# Patient Record
Sex: Female | Born: 1965 | Race: White | Hispanic: No | Marital: Married | Smoking: Never smoker
Health system: Southern US, Community
[De-identification: ages and names within clinical notes are randomized; demographics above are authoritative.]

## PROBLEM LIST (undated history)

## (undated) DIAGNOSIS — J45909 Unspecified asthma, uncomplicated: Secondary | ICD-10-CM

## (undated) DIAGNOSIS — E079 Disorder of thyroid, unspecified: Secondary | ICD-10-CM

## (undated) DIAGNOSIS — I10 Essential (primary) hypertension: Secondary | ICD-10-CM

## (undated) HISTORY — PX: VARICOSE VEIN SURGERY: SHX832

## (undated) HISTORY — PX: OTHER SURGICAL HISTORY: SHX169

## (undated) HISTORY — PX: CHOLECYSTECTOMY: SHX55

## (undated) HISTORY — PX: TONSILLECTOMY: SUR1361

## (undated) HISTORY — PX: HERNIA REPAIR: SHX51

---

## 2017-06-18 ENCOUNTER — Ambulatory Visit
Admission: RE | Admit: 2017-06-18 | Discharge: 2017-06-18 | Disposition: A | Discharge status: Home / Self Care | Payer: BC Managed Care – PPO | Source: Ambulatory Visit | Attending: Family Medicine | Admitting: Family Medicine

## 2017-06-18 ENCOUNTER — Other Ambulatory Visit: Payer: Self-pay | Admitting: Family Medicine

## 2017-06-18 DIAGNOSIS — R0602 Shortness of breath: Secondary | ICD-10-CM

## 2017-06-18 DIAGNOSIS — J4530 Mild persistent asthma, uncomplicated: Secondary | ICD-10-CM | POA: Diagnosis not present

## 2017-10-08 ENCOUNTER — Other Ambulatory Visit: Payer: Self-pay | Admitting: Family Medicine

## 2017-10-08 DIAGNOSIS — M7989 Other specified soft tissue disorders: Secondary | ICD-10-CM

## 2017-10-08 DIAGNOSIS — M79662 Pain in left lower leg: Secondary | ICD-10-CM

## 2017-10-09 ENCOUNTER — Ambulatory Visit
Admission: RE | Admit: 2017-10-09 | Discharge: 2017-10-09 | Disposition: A | Discharge status: Home / Self Care | Payer: BC Managed Care – PPO | Source: Ambulatory Visit | Attending: Family Medicine | Admitting: Family Medicine

## 2017-10-09 DIAGNOSIS — M7989 Other specified soft tissue disorders: Secondary | ICD-10-CM | POA: Diagnosis not present

## 2017-10-09 DIAGNOSIS — M79662 Pain in left lower leg: Secondary | ICD-10-CM | POA: Diagnosis not present

## 2018-05-18 IMAGING — US US EXTREM LOW VENOUS*L*
1 series · 13 of 24 positions shown · non-contrast
Comparison: None.

CLINICAL DATA: 51-year-old female with left calf pain



[Series 1: us extrem low venous*left* · 0.08mm/px · 13 of 26 slices shown]
[im 1/26]
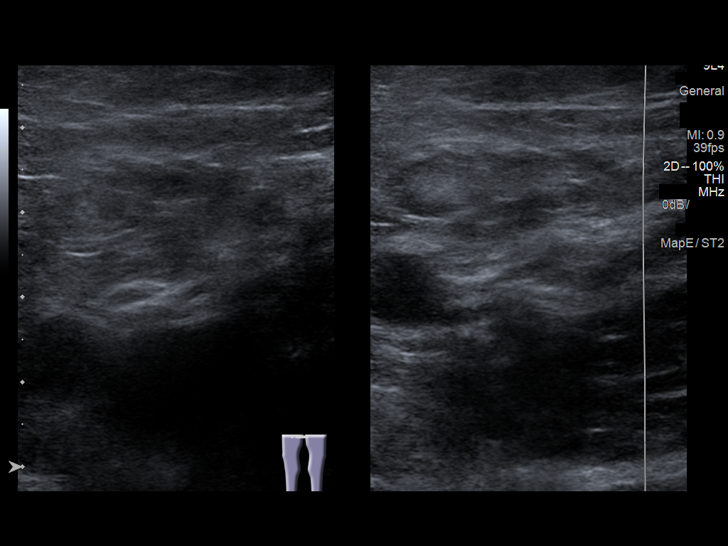
[im 3/26]
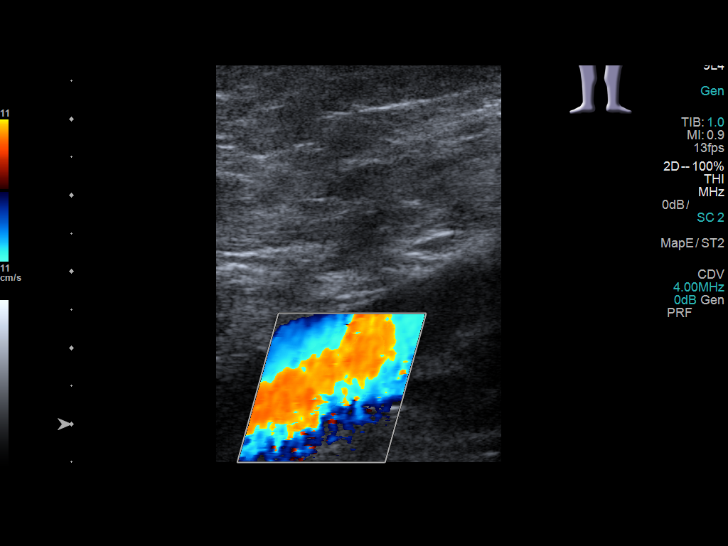
[im 5/26]
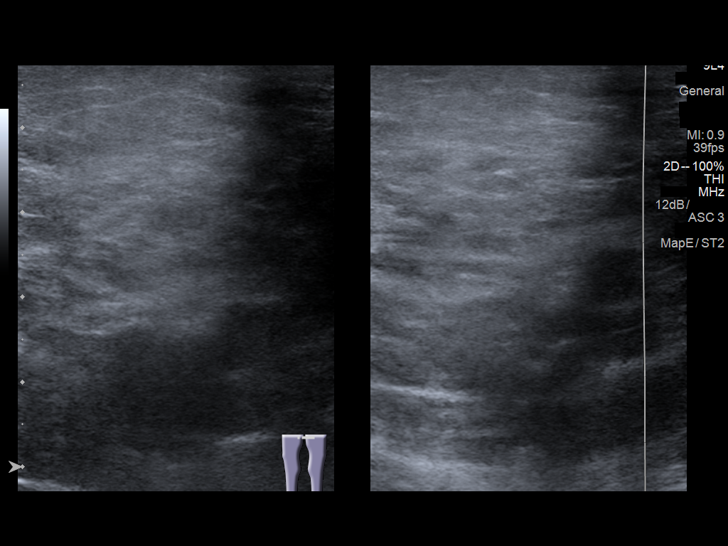
[im 7/26]
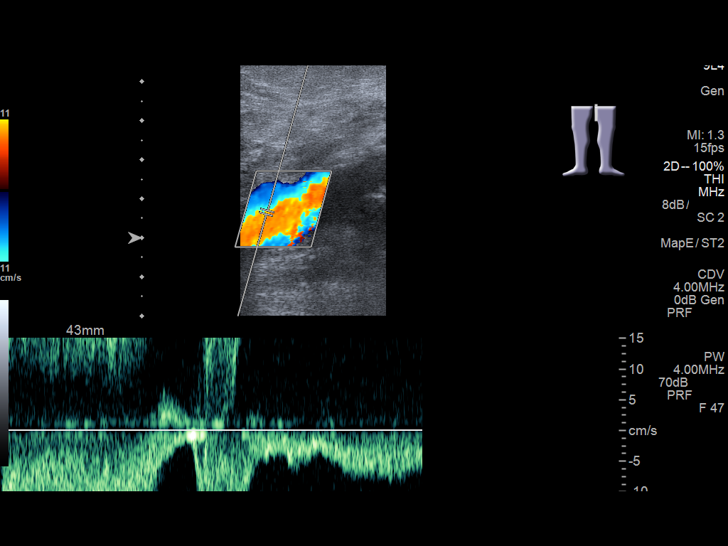
[im 9/26]
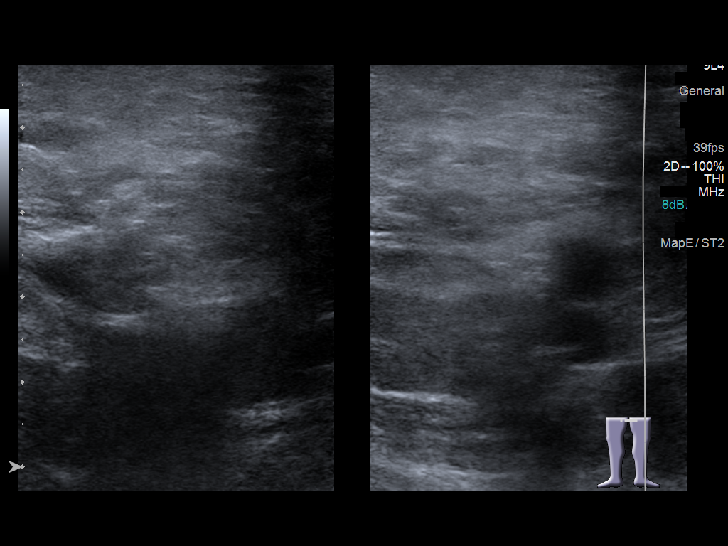
[im 11/26]
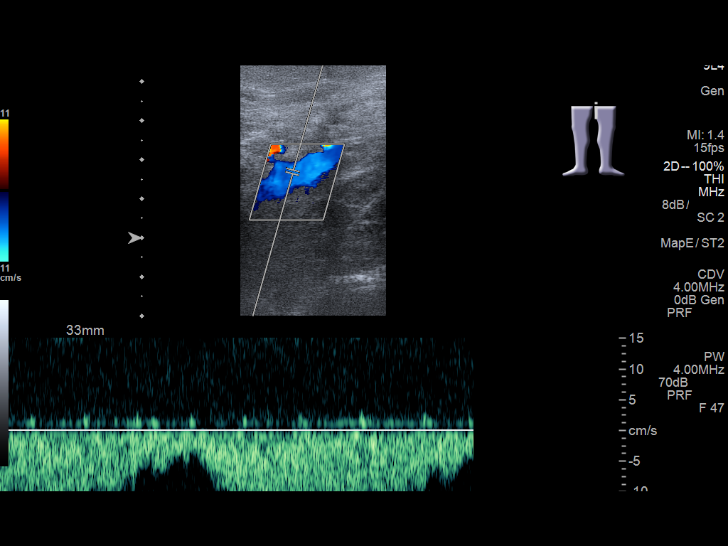
[im 14/26]
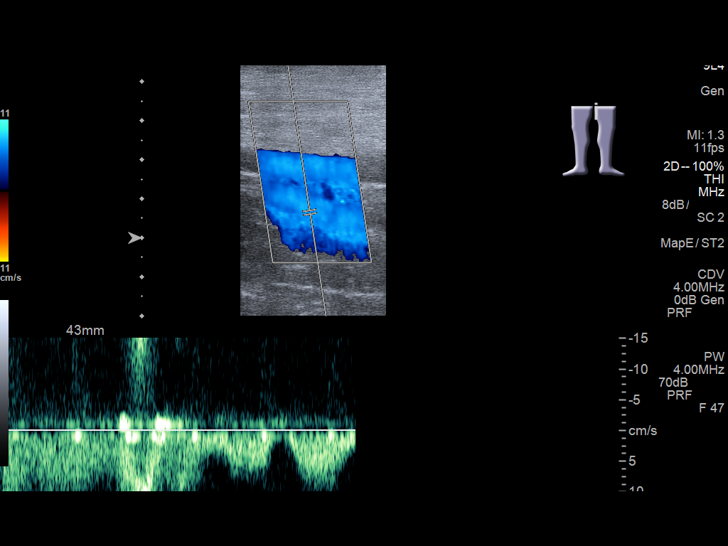
[im 15/26]
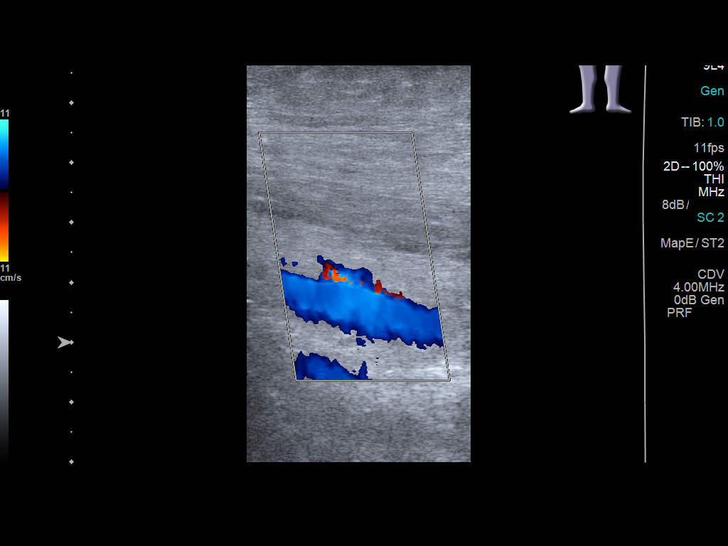
[im 17/26]
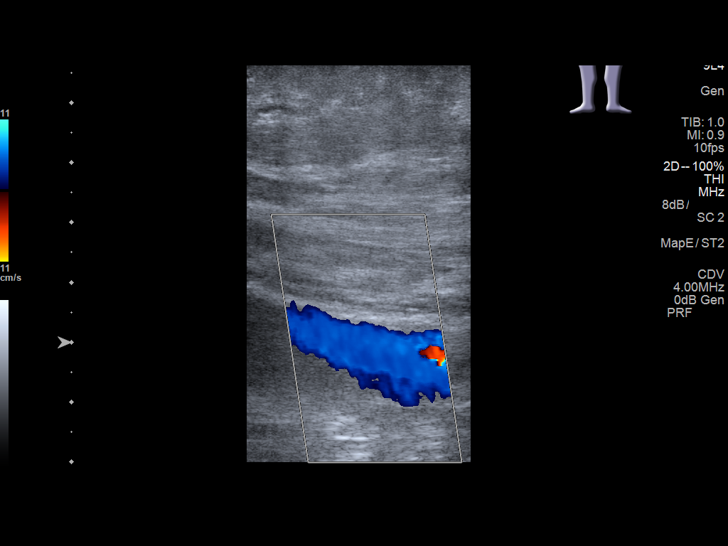
[im 19/26]
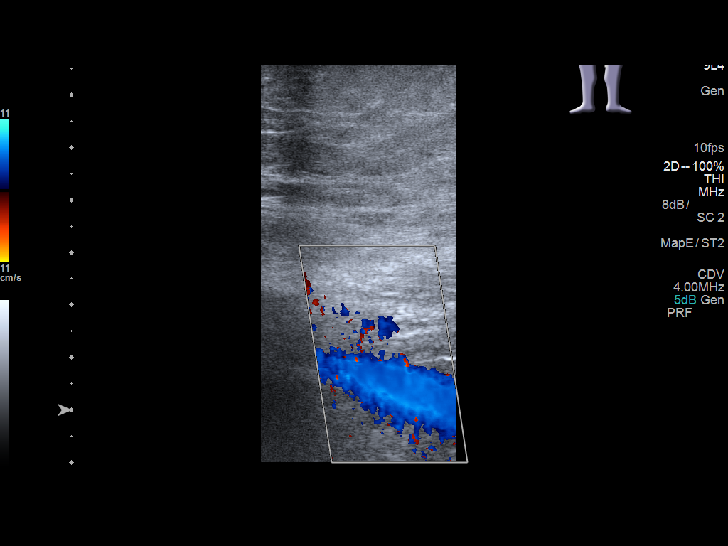
[im 21/26]
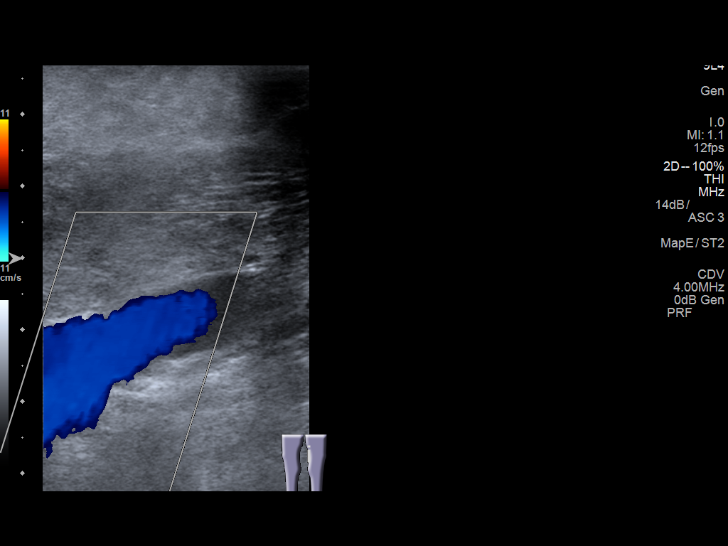
[im 23/26]
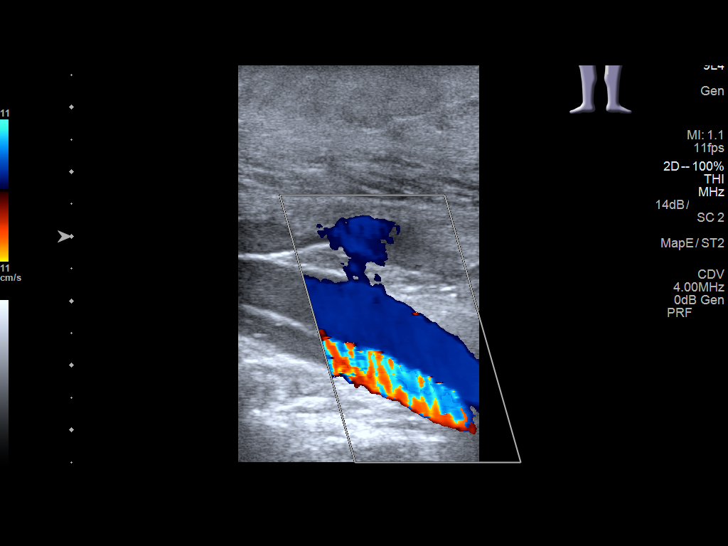
[im 26/26]
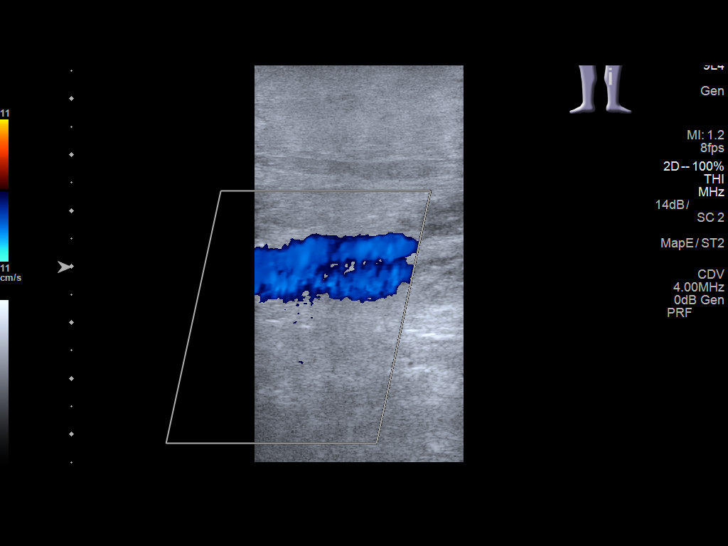

[13 of 24 positions shown; findings below may reference images not displayed]

FINDINGS: Contralateral Common Femoral Vein: Respiratory phasicity is normal
and symmetric with the symptomatic side. No evidence of thrombus.
Normal compressibility.

Common Femoral Vein: No evidence of thrombus. Normal
compressibility, respiratory phasicity and response to augmentation.

Saphenofemoral Junction: No evidence of thrombus. Normal
compressibility and flow on color Doppler imaging.

Profunda Femoral Vein: No evidence of thrombus. Normal
compressibility and flow on color Doppler imaging.

Femoral Vein: No evidence of thrombus. Normal compressibility,
respiratory phasicity and response to augmentation.

Popliteal Vein: No evidence of thrombus. Normal compressibility,
respiratory phasicity and response to augmentation.

Calf Veins: No evidence of thrombus. Normal compressibility and flow
on color Doppler imaging.

Superficial Great Saphenous Vein: No evidence of thrombus. Normal
compressibility.

Venous Reflux:  None.

Other Findings:  None.
IMPRESSION: No evidence of deep venous thrombosis.

## 2020-07-18 ENCOUNTER — Encounter: Payer: Self-pay | Admitting: Emergency Medicine

## 2020-07-18 ENCOUNTER — Ambulatory Visit
Admission: EM | Admit: 2020-07-18 | Discharge: 2020-07-18 | Disposition: A | Discharge status: Home / Self Care | Payer: BC Managed Care – PPO | Attending: Emergency Medicine | Admitting: Emergency Medicine

## 2020-07-18 ENCOUNTER — Other Ambulatory Visit: Payer: Self-pay

## 2020-07-18 DIAGNOSIS — I10 Essential (primary) hypertension: Secondary | ICD-10-CM | POA: Insufficient documentation

## 2020-07-18 DIAGNOSIS — Z79890 Hormone replacement therapy (postmenopausal): Secondary | ICD-10-CM | POA: Insufficient documentation

## 2020-07-18 DIAGNOSIS — Z20822 Contact with and (suspected) exposure to covid-19: Secondary | ICD-10-CM | POA: Diagnosis not present

## 2020-07-18 DIAGNOSIS — Z9049 Acquired absence of other specified parts of digestive tract: Secondary | ICD-10-CM | POA: Insufficient documentation

## 2020-07-18 DIAGNOSIS — E079 Disorder of thyroid, unspecified: Secondary | ICD-10-CM | POA: Insufficient documentation

## 2020-07-18 DIAGNOSIS — Z79899 Other long term (current) drug therapy: Secondary | ICD-10-CM | POA: Diagnosis not present

## 2020-07-18 DIAGNOSIS — J029 Acute pharyngitis, unspecified: Secondary | ICD-10-CM | POA: Insufficient documentation

## 2020-07-18 DIAGNOSIS — J0100 Acute maxillary sinusitis, unspecified: Secondary | ICD-10-CM | POA: Diagnosis not present

## 2020-07-18 DIAGNOSIS — R05 Cough: Secondary | ICD-10-CM | POA: Insufficient documentation

## 2020-07-18 DIAGNOSIS — J45909 Unspecified asthma, uncomplicated: Secondary | ICD-10-CM | POA: Insufficient documentation

## 2020-07-18 HISTORY — DX: Unspecified asthma, uncomplicated: J45.909

## 2020-07-18 HISTORY — DX: Essential (primary) hypertension: I10

## 2020-07-18 HISTORY — DX: Disorder of thyroid, unspecified: E07.9

## 2020-07-18 LAB — GROUP A STREP BY PCR: Group A Strep by PCR: NOT DETECTED

## 2020-07-18 MED ORDER — AMOXICILLIN-POT CLAVULANATE 875-125 MG PO TABS: 1.0000 | ORAL_TABLET | Freq: Two times a day (BID) | ORAL | 0 refills | Status: AC

## 2020-07-18 NOTE — ED Triage Notes (Signed)
Patient c/o sore throat, cough and headache that started 2 weeks ago.

## 2020-07-18 NOTE — Discharge Instructions (Signed)
Begin antibiotics for suspected sinus infection.  Also, consider use of nasal saline and Flonase.  Increase fluid intake and rest.  Covid test has been sent and your result be back within 24 hours.  Blood pressure is elevated today.  It is not significantly elevated so at this time you can just monitor yourself at home.  Keep a log of your blood pressures at least daily and follow-up with your PCP in case they would want to do any medication changes.  Also try to increase her exercise and lose weight.  You have received COVID testing today either for positive exposure, concerning symptoms that could be related to COVID infection, screening purposes, or re-testing after confirmed positive.  Your test obtained today checks for active viral infection in the last 1-2 weeks. If your test is negative now, you can still test positive later. So, if you do develop symptoms you should either get re-tested and/or isolate x 10 days. Please follow CDC guidelines.  While Rapid antigen tests come back in 15-20 minutes, send out PCR/molecular test results typically come back within 24 hours. In the mean time, if you are symptomatic, assume this could be a positive test and treat/monitor yourself as if you do have COVID.   We will call with test results. Please download the MyChart app and set up a profile to access test results.   If symptomatic, go home and rest. Push fluids. Take Tylenol as needed for discomfort. Gargle warm salt water. Throat lozenges. Take Mucinex DM or Robitussin for cough. Humidifier in bedroom to ease coughing. Warm showers. Also review the COVID handout for more information.  COVID-19 INFECTION: The incubation period of COVID-19 is approximately 14 days after exposure, with most symptoms developing in roughly 4-5 days. Symptoms may range in severity from mild to critically severe. Roughly 80% of those infected will have mild symptoms. People of any age may become infected with COVID-19 and have  the ability to transmit the virus. The most common symptoms include: fever, fatigue, cough, body aches, headaches, sore throat, nasal congestion, shortness of breath, nausea, vomiting, diarrhea, changes in smell and/or taste.    COURSE OF ILLNESS Some patients may begin with mild disease which can progress quickly into critical symptoms. If your symptoms are worsening please call ahead to the Emergency Department and proceed there for further treatment. Recovery time appears to be roughly 1-2 weeks for mild symptoms and 3-6 weeks for severe disease.   GO IMMEDIATELY TO ER FOR FEVER YOU ARE UNABLE TO GET DOWN WITH TYLENOL, BREATHING PROBLEMS, CHEST PAIN, FATIGUE, LETHARGY, INABILITY TO EAT OR DRINK, ETC  QUARANTINE AND ISOLATION: To help decrease the spread of COVID-19 please remain isolated if you have COVID infection or are highly suspected to have COVID infection. This means -stay home and isolate to one room in the home if you live with others. Do not share a bed or bathroom with others while ill, sanitize and wipe down all countertops and keep common areas clean and disinfected. You may discontinue isolation if you have a mild case and are asymptomatic 10 days after symptom onset as long as you have been fever free >24 hours without having to take Motrin or Tylenol. If your case is more severe (meaning you develop pneumonia or are admitted in the hospital), you may have to isolate longer.   If you have been in close contact (within 6 feet) of someone diagnosed with COVID 19, you are advised to quarantine in your home for  14 days as symptoms can develop anywhere from 2-14 days after exposure to the virus. If you develop symptoms, you  must isolate.  Most current guidelines for COVID after exposure -isolate 10 days if you ARE NOT tested for COVID as long as symptoms do not develop -isolate 7 days if you are tested and remain asymptomatic -You do not necessarily need to be tested for COVID if you have +  exposure and        develop   symptoms. Just isolate at home x10 days from symptom onset During this global pandemic, CDC advises to practice social distancing, try to stay at least 29ft away from others at all times. Wear a face covering. Wash and sanitize your hands regularly and avoid going anywhere that is not necessary.  KEEP IN MIND THAT THE COVID TEST IS NOT 100% ACCURATE AND YOU SHOULD STILL DO EVERYTHING TO PREVENT POTENTIAL SPREAD OF VIRUS TO OTHERS (WEAR MASK, WEAR GLOVES, WASH HANDS AND SANITIZE REGULARLY). IF INITIAL TEST IS NEGATIVE, THIS MAY NOT MEAN YOU ARE DEFINITELY NEGATIVE. MOST ACCURATE TESTING IS DONE 5-7 DAYS AFTER EXPOSURE.   It is not advised by CDC to get re-tested after receiving a positive COVID test since you can still test positive for weeks to months after you have already cleared the virus.   *If you have not been vaccinated for COVID, I strongly suggest you consider getting vaccinated as long as there are no contraindications.

## 2020-07-18 NOTE — ED Provider Notes (Signed)
[facility]    CSN: [id number] Arrival date & time: 07/18/20  1926      History   Chief Complaint Chief Complaint  Patient presents with  . Sore Throat  . Cough  . Headache    HPI Alexandra Berg is a 54 y.o. female.   Patient presents for 2-week history of right-sided ear pain, right-sided sinus pain, sore throat and postnasal drainage, dry cough, and fatigue.  She stated she felt like she was getting better until today when she started to feel worse.  Patient denies any fever.  She does have a past medical history significant for asthma.  Patient denies any chest tightness or breathing difficulty.  She denies any abdominal pain, nausea, vomiting.  She admits to some headaches.  Patient has been taking over-the-counter medicines without relief.  She states she is going to see her mother soon and wants to get a Covid test and treated for what ever type of infection she has before she visits.  Patient denies any known Covid exposure and has been fully vaccinated for Covid.  Patient does admit to history of hypertension and states that her blood pressures have been in the 140s over 90s recently.  She says she was switched from metoprolol to lisinopril a couple months ago and her blood pressure has been a little elevated.  She denies any other concerns.  HPI  Past Medical History:  Diagnosis Date  . Asthma   . Hypertension   . Thyroid disease     There are no problems to display for this patient.   Past Surgical History:  Procedure Laterality Date  . CHOLECYSTECTOMY    . HERNIA REPAIR    . TONSILLECTOMY    . VARICOSE VEIN SURGERY    . wart removal      OB History   No obstetric history on file.      Home Medications    Prior to Admission medications   Medication Sig Start Date End Date Taking? Authorizing Provider  albuterol (VENTOLIN HFA) 108 (90 Base) MCG/ACT inhaler albuterol sulfate HFA 90 mcg/actuation aerosol inhaler  INHALE 2 PUFFS BY MOUTH EVERY 6  HOURS AS NEEDED FOR WHEEZING 11/25/19  Yes [provider]  levothyroxine (EUTHYROX) 150 MCG tablet  10/20/19  Yes [provider]  lisinopril (ZESTRIL) 40 MG tablet Take 40 mg by mouth daily. 06/07/20  Yes [provider]  omeprazole (PRILOSEC) 20 MG capsule omeprazole 20 mg capsule,delayed release 03/26/11  Yes [provider]  amoxicillin-clavulanate (AUGMENTIN) 875-125 MG tablet Take 1 tablet by mouth every 12 (twelve) hours for 7 days. 07/18/20 07/25/20  Shirlee LatchEaves, Merci Walthers B, PA-C    Family History Family History  Problem Relation Age of Onset  . Osteoarthritis Mother     Social History Social History   Tobacco Use  . Smoking status: Never Smoker  . Smokeless tobacco: Never Used  Vaping Use  . Vaping Use: Never used  Substance Use Topics  . Alcohol use: Never  . Drug use: Never     Allergies   Patient has no known allergies.   Review of Systems Review of Systems  Constitutional: Positive for fatigue. Negative for chills, diaphoresis and fever.  HENT: Positive for congestion, ear pain, sinus pressure, sinus pain and sore throat. Negative for rhinorrhea.   Respiratory: Positive for cough. Negative for shortness of breath.   Gastrointestinal: Negative for abdominal pain, nausea and vomiting.  Musculoskeletal: Negative for arthralgias and myalgias.  Skin: Negative for rash.  Neurological: Positive for headaches. Negative for weakness.  Hematological: Negative for adenopathy.     Physical Exam Triage Vital Signs ED Triage Vitals  Enc Vitals Group     BP      Pulse      Resp      Temp      Temp src      SpO2      Weight      Height      Head Circumference      Peak Flow      Pain Score      Pain Loc      Pain Edu?      Excl. in GC?    No data found.  Updated Vital Signs BP (!) 151/103 (BP Location: Right Arm)   Pulse 76   Temp 99.2 F (37.3 C) (Oral)   Resp 18   Ht 5\' 11"  (1.803 m)   Wt (!) 347 lb (157.4 kg)   SpO2 97%    BMI 48.40 kg/m    Physical Exam Vitals and nursing note reviewed.  Constitutional:      General: She is not in acute distress.    Appearance: Normal appearance. She is obese. She is not ill-appearing or toxic-appearing.  HENT:     Head: Normocephalic and atraumatic.     Right Ear: Hearing, ear canal and external ear normal. A middle ear effusion is present. Tympanic membrane is injected.     Left Ear: Hearing, ear canal and external ear normal. A middle ear effusion is present.     Nose: Septal deviation, mucosal edema and congestion present.     Right Sinus: Maxillary sinus tenderness present.     Mouth/Throat:     Mouth: Mucous membranes are moist.     Pharynx: Oropharynx is clear. Posterior oropharyngeal erythema (with clear PND) present.  Eyes:     General: No scleral icterus.       Right eye: No discharge.        Left eye: No discharge.     Conjunctiva/sclera: Conjunctivae normal.  Cardiovascular:     Rate and Rhythm: Normal rate and regular rhythm.     Heart sounds: Normal heart sounds.  Pulmonary:     Effort: Pulmonary effort is normal. No respiratory distress.     Breath sounds: Normal breath sounds. No wheezing, rhonchi or rales.  Musculoskeletal:     Cervical back: Neck supple.  Skin:    General: Skin is dry.  Neurological:     General: No focal deficit present.     Mental Status: She is alert. Mental status is at baseline.     Motor: No weakness.     Gait: Gait normal.  Psychiatric:        Mood and Affect: Mood normal.        Behavior: Behavior normal.        Thought Content: Thought content normal.      UC Treatments / Results  Labs (all labs ordered are listed, but only abnormal results are displayed) Labs Reviewed  SARS CORONAVIRUS 2 (TAT 6-24 HRS)  GROUP A STREP BY PCR    EKG   Radiology No results found.  Procedures Procedures (including critical care time)  Medications Ordered in UC Medications - No data to display  Initial  Impression / Assessment and Plan / UC Course  I have reviewed the triage vital signs and the nursing notes.  Pertinent labs & imaging results that were available during  my care of the patient were reviewed by me and considered in my medical decision making (see chart for details).   Based on patient symptoms suspect secondary bacterial sinusitis.  Treating with Augmentin at this time.  Advised over-the-counter Flonase and Coricidin HBP.  Advised increased rest and fluids.  Covid test also obtained.  CDC guidelines, isolation protocol and ED precautions discussed if positive.   Patient mentions concerns about blood pressure.  Advised patient that I do not want to change her blood pressure medicine.  Advised her to keep a log of her blood pressure and follow-up with PCP.  She does not have any symptoms of severe hypertensive crisis.  Patient is stable.  Advised to follow-up with the ED sooner if she develops any significant hypertension associated with severe headaches, vision changes, chest pain, breathing problems, dizziness or vomiting.   Final Clinical Impressions(s) / UC Diagnoses   Final diagnoses:  Acute maxillary sinusitis, recurrence not specified  Sore throat  Essential hypertension     Discharge Instructions     Begin antibiotics for suspected sinus infection.  Also, consider use of nasal saline and Flonase.  Increase fluid intake and rest.  Covid test has been sent and your result be back within 24 hours.  Blood pressure is elevated today.  It is not significantly elevated so at this time you can just monitor yourself at home.  Keep a log of your blood pressures at least daily and follow-up with your PCP in case they would want to do any medication changes.  Also try to increase her exercise and lose weight.  You have received COVID testing today either for positive exposure, concerning symptoms that could be related to COVID infection, screening purposes, or re-testing after  confirmed positive.  Your test obtained today checks for active viral infection in the last 1-2 weeks. If your test is negative now, you can still test positive later. So, if you do develop symptoms you should either get re-tested and/or isolate x 10 days. Please follow CDC guidelines.  While Rapid antigen tests come back in 15-20 minutes, send out PCR/molecular test results typically come back within 24 hours. In the mean time, if you are symptomatic, assume this could be a positive test and treat/monitor yourself as if you do have COVID.   We will call with test results. Please download the MyChart app and set up a profile to access test results.   If symptomatic, go home and rest. Push fluids. Take Tylenol as needed for discomfort. Gargle warm salt water. Throat lozenges. Take Mucinex DM or Robitussin for cough. Humidifier in bedroom to ease coughing. Warm showers. Also review the COVID handout for more information.  COVID-19 INFECTION: The incubation period of COVID-19 is approximately 14 days after exposure, with most symptoms developing in roughly 4-5 days. Symptoms may range in severity from mild to critically severe. Roughly 80% of those infected will have mild symptoms. People of any age may become infected with COVID-19 and have the ability to transmit the virus. The most common symptoms include: fever, fatigue, cough, body aches, headaches, sore throat, nasal congestion, shortness of breath, nausea, vomiting, diarrhea, changes in smell and/or taste.    COURSE OF ILLNESS Some patients may begin with mild disease which can progress quickly into critical symptoms. If your symptoms are worsening please call ahead to the Emergency Department and proceed there for further treatment. Recovery time appears to be roughly 1-2 weeks for mild symptoms and 3-6 weeks for severe disease.  GO IMMEDIATELY TO ER FOR FEVER YOU ARE UNABLE TO GET DOWN WITH TYLENOL, BREATHING PROBLEMS, CHEST PAIN, FATIGUE,  LETHARGY, INABILITY TO EAT OR DRINK, ETC  QUARANTINE AND ISOLATION: To help decrease the spread of COVID-19 please remain isolated if you have COVID infection or are highly suspected to have COVID infection. This means -stay home and isolate to one room in the home if you live with others. Do not share a bed or bathroom with others while ill, sanitize and wipe down all countertops and keep common areas clean and disinfected. You may discontinue isolation if you have a mild case and are asymptomatic 10 days after symptom onset as long as you have been fever free >24 hours without having to take Motrin or Tylenol. If your case is more severe (meaning you develop pneumonia or are admitted in the hospital), you may have to isolate longer.   If you have been in close contact (within 6 feet) of someone diagnosed with COVID 19, you are advised to quarantine in your home for 14 days as symptoms can develop anywhere from 2-14 days after exposure to the virus. If you develop symptoms, you  must isolate.  Most current guidelines for COVID after exposure -isolate 10 days if you ARE NOT tested for COVID as long as symptoms do not develop -isolate 7 days if you are tested and remain asymptomatic -You do not necessarily need to be tested for COVID if you have + exposure and        develop   symptoms. Just isolate at home x10 days from symptom onset During this global pandemic, CDC advises to practice social distancing, try to stay at least 43ft away from others at all times. Wear a face covering. Wash and sanitize your hands regularly and avoid going anywhere that is not necessary.  KEEP IN MIND THAT THE COVID TEST IS NOT 100% ACCURATE AND YOU SHOULD STILL DO EVERYTHING TO PREVENT POTENTIAL SPREAD OF VIRUS TO OTHERS (WEAR MASK, WEAR GLOVES, WASH HANDS AND SANITIZE REGULARLY). IF INITIAL TEST IS NEGATIVE, THIS MAY NOT MEAN YOU ARE DEFINITELY NEGATIVE. MOST ACCURATE TESTING IS DONE 5-7 DAYS AFTER EXPOSURE.   It is not  advised by CDC to get re-tested after receiving a positive COVID test since you can still test positive for weeks to months after you have already cleared the virus.   *If you have not been vaccinated for COVID, I strongly suggest you consider getting vaccinated as long as there are no contraindications.      ED Prescriptions    Medication Sig Dispense Auth. Provider   amoxicillin-clavulanate (AUGMENTIN) 875-125 MG tablet Take 1 tablet by mouth every 12 (twelve) hours for 7 days. 14 tablet Gareth Morgan     PDMP not reviewed this encounter.   Shirlee Latch, PA-C 07/18/20 2029

## 2020-07-19 LAB — SARS CORONAVIRUS 2 (TAT 6-24 HRS): SARS Coronavirus 2: NEGATIVE

## 2024-05-31 ENCOUNTER — Other Ambulatory Visit: Payer: Self-pay | Admitting: Medical Genetics

## 2024-07-01 ENCOUNTER — Other Ambulatory Visit: Payer: Self-pay | Admitting: Medical Genetics

## 2024-07-01 DIAGNOSIS — Z006 Encounter for examination for normal comparison and control in clinical research program: Secondary | ICD-10-CM

## 2024-07-22 LAB — GENECONNECT MOLECULAR SCREEN: Genetic Analysis Overall Interpretation: NEGATIVE
